# Patient Record
Sex: Male | Born: 1940 | Race: White | Hispanic: No | Marital: Married
Health system: Southern US, Community
[De-identification: ages and names within clinical notes are randomized; demographics above are authoritative.]

---

## 2015-06-18 DIAGNOSIS — Z5181 Encounter for therapeutic drug level monitoring: Secondary | ICD-10-CM | POA: Diagnosis not present

## 2015-06-18 DIAGNOSIS — Z7901 Long term (current) use of anticoagulants: Secondary | ICD-10-CM | POA: Diagnosis not present

## 2015-06-18 DIAGNOSIS — I480 Paroxysmal atrial fibrillation: Secondary | ICD-10-CM | POA: Diagnosis not present

## 2015-06-18 DIAGNOSIS — N189 Chronic kidney disease, unspecified: Secondary | ICD-10-CM | POA: Diagnosis not present

## 2015-08-20 DIAGNOSIS — Z7901 Long term (current) use of anticoagulants: Secondary | ICD-10-CM | POA: Diagnosis not present

## 2015-09-24 DIAGNOSIS — N189 Chronic kidney disease, unspecified: Secondary | ICD-10-CM | POA: Diagnosis not present

## 2015-09-24 DIAGNOSIS — I10 Essential (primary) hypertension: Secondary | ICD-10-CM | POA: Diagnosis not present

## 2015-09-24 DIAGNOSIS — E559 Vitamin D deficiency, unspecified: Secondary | ICD-10-CM | POA: Diagnosis not present

## 2015-09-24 DIAGNOSIS — Z8349 Family history of other endocrine, nutritional and metabolic diseases: Secondary | ICD-10-CM | POA: Diagnosis not present

## 2015-09-24 DIAGNOSIS — Z125 Encounter for screening for malignant neoplasm of prostate: Secondary | ICD-10-CM | POA: Diagnosis not present

## 2015-09-24 DIAGNOSIS — I129 Hypertensive chronic kidney disease with stage 1 through stage 4 chronic kidney disease, or unspecified chronic kidney disease: Secondary | ICD-10-CM | POA: Diagnosis not present

## 2015-09-24 DIAGNOSIS — Z8639 Personal history of other endocrine, nutritional and metabolic disease: Secondary | ICD-10-CM | POA: Diagnosis not present

## 2015-09-24 DIAGNOSIS — Z0000 Encounter for general adult medical examination without abnormal findings: Secondary | ICD-10-CM | POA: Diagnosis not present

## 2015-09-24 DIAGNOSIS — E785 Hyperlipidemia, unspecified: Secondary | ICD-10-CM | POA: Diagnosis not present

## 2015-09-24 DIAGNOSIS — Z7901 Long term (current) use of anticoagulants: Secondary | ICD-10-CM | POA: Diagnosis not present

## 2015-09-24 DIAGNOSIS — I480 Paroxysmal atrial fibrillation: Secondary | ICD-10-CM | POA: Diagnosis not present

## 2015-09-24 DIAGNOSIS — Z79899 Other long term (current) drug therapy: Secondary | ICD-10-CM | POA: Diagnosis not present

## 2015-09-24 DIAGNOSIS — I4891 Unspecified atrial fibrillation: Secondary | ICD-10-CM | POA: Diagnosis not present

## 2015-10-03 DIAGNOSIS — N179 Acute kidney failure, unspecified: Secondary | ICD-10-CM | POA: Diagnosis not present

## 2015-10-15 DIAGNOSIS — Z7901 Long term (current) use of anticoagulants: Secondary | ICD-10-CM | POA: Diagnosis not present

## 2015-10-15 DIAGNOSIS — I480 Paroxysmal atrial fibrillation: Secondary | ICD-10-CM | POA: Diagnosis not present

## 2015-10-15 DIAGNOSIS — Z5181 Encounter for therapeutic drug level monitoring: Secondary | ICD-10-CM | POA: Diagnosis not present

## 2015-11-12 DIAGNOSIS — Z7901 Long term (current) use of anticoagulants: Secondary | ICD-10-CM | POA: Diagnosis not present

## 2015-11-12 DIAGNOSIS — I480 Paroxysmal atrial fibrillation: Secondary | ICD-10-CM | POA: Diagnosis not present

## 2015-12-24 DIAGNOSIS — I480 Paroxysmal atrial fibrillation: Secondary | ICD-10-CM | POA: Diagnosis not present

## 2015-12-24 DIAGNOSIS — I499 Cardiac arrhythmia, unspecified: Secondary | ICD-10-CM | POA: Diagnosis not present

## 2016-01-07 DIAGNOSIS — I480 Paroxysmal atrial fibrillation: Secondary | ICD-10-CM | POA: Diagnosis not present

## 2016-01-21 DIAGNOSIS — I480 Paroxysmal atrial fibrillation: Secondary | ICD-10-CM | POA: Diagnosis not present

## 2016-01-21 DIAGNOSIS — Z7901 Long term (current) use of anticoagulants: Secondary | ICD-10-CM | POA: Diagnosis not present

## 2016-01-30 DIAGNOSIS — Z7901 Long term (current) use of anticoagulants: Secondary | ICD-10-CM | POA: Diagnosis not present

## 2016-01-30 DIAGNOSIS — I480 Paroxysmal atrial fibrillation: Secondary | ICD-10-CM | POA: Diagnosis not present

## 2016-03-02 DIAGNOSIS — I480 Paroxysmal atrial fibrillation: Secondary | ICD-10-CM | POA: Diagnosis not present

## 2016-03-02 DIAGNOSIS — Z7901 Long term (current) use of anticoagulants: Secondary | ICD-10-CM | POA: Diagnosis not present

## 2016-03-02 DIAGNOSIS — E7800 Pure hypercholesterolemia, unspecified: Secondary | ICD-10-CM | POA: Diagnosis not present

## 2016-03-02 DIAGNOSIS — I10 Essential (primary) hypertension: Secondary | ICD-10-CM | POA: Diagnosis not present

## 2016-03-04 DIAGNOSIS — I480 Paroxysmal atrial fibrillation: Secondary | ICD-10-CM | POA: Diagnosis not present

## 2016-03-10 DIAGNOSIS — Z7901 Long term (current) use of anticoagulants: Secondary | ICD-10-CM | POA: Diagnosis not present

## 2016-03-10 DIAGNOSIS — I480 Paroxysmal atrial fibrillation: Secondary | ICD-10-CM | POA: Diagnosis not present

## 2016-03-17 DIAGNOSIS — Z7901 Long term (current) use of anticoagulants: Secondary | ICD-10-CM | POA: Diagnosis not present

## 2016-03-17 DIAGNOSIS — I480 Paroxysmal atrial fibrillation: Secondary | ICD-10-CM | POA: Diagnosis not present

## 2016-03-24 DIAGNOSIS — I480 Paroxysmal atrial fibrillation: Secondary | ICD-10-CM | POA: Diagnosis not present

## 2016-03-24 DIAGNOSIS — Z7901 Long term (current) use of anticoagulants: Secondary | ICD-10-CM | POA: Diagnosis not present

## 2016-03-29 DIAGNOSIS — I480 Paroxysmal atrial fibrillation: Secondary | ICD-10-CM | POA: Diagnosis not present

## 2016-03-30 DIAGNOSIS — E785 Hyperlipidemia, unspecified: Secondary | ICD-10-CM | POA: Diagnosis not present

## 2016-03-30 DIAGNOSIS — I480 Paroxysmal atrial fibrillation: Secondary | ICD-10-CM | POA: Diagnosis not present

## 2016-03-30 DIAGNOSIS — Z79899 Other long term (current) drug therapy: Secondary | ICD-10-CM | POA: Diagnosis not present

## 2016-03-30 DIAGNOSIS — I10 Essential (primary) hypertension: Secondary | ICD-10-CM | POA: Diagnosis not present

## 2016-03-30 DIAGNOSIS — I129 Hypertensive chronic kidney disease with stage 1 through stage 4 chronic kidney disease, or unspecified chronic kidney disease: Secondary | ICD-10-CM | POA: Diagnosis not present

## 2016-03-30 DIAGNOSIS — Z7901 Long term (current) use of anticoagulants: Secondary | ICD-10-CM | POA: Diagnosis not present

## 2016-03-30 DIAGNOSIS — N189 Chronic kidney disease, unspecified: Secondary | ICD-10-CM | POA: Diagnosis not present

## 2016-03-31 DIAGNOSIS — I480 Paroxysmal atrial fibrillation: Secondary | ICD-10-CM | POA: Diagnosis not present

## 2016-04-06 DIAGNOSIS — Z7901 Long term (current) use of anticoagulants: Secondary | ICD-10-CM | POA: Diagnosis not present

## 2016-04-06 DIAGNOSIS — I480 Paroxysmal atrial fibrillation: Secondary | ICD-10-CM | POA: Diagnosis not present

## 2016-04-06 DIAGNOSIS — Z5181 Encounter for therapeutic drug level monitoring: Secondary | ICD-10-CM | POA: Diagnosis not present

## 2016-04-13 DIAGNOSIS — I480 Paroxysmal atrial fibrillation: Secondary | ICD-10-CM | POA: Diagnosis not present

## 2016-04-13 DIAGNOSIS — Z7901 Long term (current) use of anticoagulants: Secondary | ICD-10-CM | POA: Diagnosis not present

## 2016-04-27 DIAGNOSIS — Z7901 Long term (current) use of anticoagulants: Secondary | ICD-10-CM | POA: Diagnosis not present

## 2016-05-24 DIAGNOSIS — I480 Paroxysmal atrial fibrillation: Secondary | ICD-10-CM | POA: Diagnosis not present

## 2016-06-08 DIAGNOSIS — I480 Paroxysmal atrial fibrillation: Secondary | ICD-10-CM | POA: Diagnosis not present

## 2016-06-08 DIAGNOSIS — Z7901 Long term (current) use of anticoagulants: Secondary | ICD-10-CM | POA: Diagnosis not present

## 2016-07-08 DIAGNOSIS — Z7901 Long term (current) use of anticoagulants: Secondary | ICD-10-CM | POA: Diagnosis not present

## 2016-07-08 DIAGNOSIS — I480 Paroxysmal atrial fibrillation: Secondary | ICD-10-CM | POA: Diagnosis not present

## 2016-08-20 DIAGNOSIS — H00014 Hordeolum externum left upper eyelid: Secondary | ICD-10-CM | POA: Diagnosis not present

## 2016-08-20 DIAGNOSIS — H01022 Squamous blepharitis right lower eyelid: Secondary | ICD-10-CM | POA: Diagnosis not present

## 2016-08-20 DIAGNOSIS — H01025 Squamous blepharitis left lower eyelid: Secondary | ICD-10-CM | POA: Diagnosis not present

## 2016-08-24 DIAGNOSIS — I480 Paroxysmal atrial fibrillation: Secondary | ICD-10-CM | POA: Diagnosis not present

## 2016-08-24 DIAGNOSIS — Z7901 Long term (current) use of anticoagulants: Secondary | ICD-10-CM | POA: Diagnosis not present

## 2016-09-28 DIAGNOSIS — E782 Mixed hyperlipidemia: Secondary | ICD-10-CM | POA: Diagnosis not present

## 2016-09-28 DIAGNOSIS — I480 Paroxysmal atrial fibrillation: Secondary | ICD-10-CM | POA: Diagnosis not present

## 2016-09-28 DIAGNOSIS — I4891 Unspecified atrial fibrillation: Secondary | ICD-10-CM | POA: Diagnosis not present

## 2016-09-28 DIAGNOSIS — N189 Chronic kidney disease, unspecified: Secondary | ICD-10-CM | POA: Diagnosis not present

## 2016-09-28 DIAGNOSIS — Z0000 Encounter for general adult medical examination without abnormal findings: Secondary | ICD-10-CM | POA: Diagnosis not present

## 2016-09-28 DIAGNOSIS — I10 Essential (primary) hypertension: Secondary | ICD-10-CM | POA: Diagnosis not present

## 2016-09-28 DIAGNOSIS — Z7901 Long term (current) use of anticoagulants: Secondary | ICD-10-CM | POA: Diagnosis not present

## 2016-10-18 DIAGNOSIS — M25531 Pain in right wrist: Secondary | ICD-10-CM | POA: Diagnosis not present

## 2016-10-18 DIAGNOSIS — M25431 Effusion, right wrist: Secondary | ICD-10-CM | POA: Diagnosis not present

## 2016-10-18 DIAGNOSIS — I480 Paroxysmal atrial fibrillation: Secondary | ICD-10-CM | POA: Diagnosis not present

## 2016-10-19 DIAGNOSIS — I480 Paroxysmal atrial fibrillation: Secondary | ICD-10-CM | POA: Diagnosis not present

## 2016-10-19 DIAGNOSIS — E782 Mixed hyperlipidemia: Secondary | ICD-10-CM | POA: Diagnosis not present

## 2016-10-19 DIAGNOSIS — I10 Essential (primary) hypertension: Secondary | ICD-10-CM | POA: Diagnosis not present

## 2016-11-05 DIAGNOSIS — I480 Paroxysmal atrial fibrillation: Secondary | ICD-10-CM | POA: Diagnosis not present

## 2016-11-05 DIAGNOSIS — Z7901 Long term (current) use of anticoagulants: Secondary | ICD-10-CM | POA: Diagnosis not present

## 2016-11-09 DIAGNOSIS — Z125 Encounter for screening for malignant neoplasm of prostate: Secondary | ICD-10-CM | POA: Diagnosis not present

## 2016-11-09 DIAGNOSIS — M65841 Other synovitis and tenosynovitis, right hand: Secondary | ICD-10-CM | POA: Diagnosis not present

## 2016-11-09 DIAGNOSIS — Z79899 Other long term (current) drug therapy: Secondary | ICD-10-CM | POA: Diagnosis not present

## 2016-11-09 DIAGNOSIS — Z0000 Encounter for general adult medical examination without abnormal findings: Secondary | ICD-10-CM | POA: Diagnosis not present

## 2016-11-30 DIAGNOSIS — I480 Paroxysmal atrial fibrillation: Secondary | ICD-10-CM | POA: Diagnosis not present

## 2016-11-30 DIAGNOSIS — Z7901 Long term (current) use of anticoagulants: Secondary | ICD-10-CM | POA: Diagnosis not present

## 2017-01-03 DIAGNOSIS — I480 Paroxysmal atrial fibrillation: Secondary | ICD-10-CM | POA: Diagnosis not present

## 2017-01-03 DIAGNOSIS — Z7901 Long term (current) use of anticoagulants: Secondary | ICD-10-CM | POA: Diagnosis not present

## 2017-01-20 DIAGNOSIS — L720 Epidermal cyst: Secondary | ICD-10-CM | POA: Diagnosis not present

## 2017-01-20 DIAGNOSIS — D1801 Hemangioma of skin and subcutaneous tissue: Secondary | ICD-10-CM | POA: Diagnosis not present

## 2017-01-20 DIAGNOSIS — L738 Other specified follicular disorders: Secondary | ICD-10-CM | POA: Diagnosis not present

## 2017-01-20 DIAGNOSIS — L817 Pigmented purpuric dermatosis: Secondary | ICD-10-CM | POA: Diagnosis not present

## 2017-01-20 DIAGNOSIS — D229 Melanocytic nevi, unspecified: Secondary | ICD-10-CM | POA: Diagnosis not present

## 2017-01-20 DIAGNOSIS — L821 Other seborrheic keratosis: Secondary | ICD-10-CM | POA: Diagnosis not present

## 2017-01-28 DIAGNOSIS — I480 Paroxysmal atrial fibrillation: Secondary | ICD-10-CM | POA: Diagnosis not present

## 2017-01-28 DIAGNOSIS — Z7901 Long term (current) use of anticoagulants: Secondary | ICD-10-CM | POA: Diagnosis not present

## 2017-02-18 DIAGNOSIS — I480 Paroxysmal atrial fibrillation: Secondary | ICD-10-CM | POA: Diagnosis not present

## 2017-02-18 DIAGNOSIS — Z7901 Long term (current) use of anticoagulants: Secondary | ICD-10-CM | POA: Diagnosis not present

## 2017-03-08 DIAGNOSIS — I10 Essential (primary) hypertension: Secondary | ICD-10-CM | POA: Diagnosis not present

## 2017-03-08 DIAGNOSIS — E782 Mixed hyperlipidemia: Secondary | ICD-10-CM | POA: Diagnosis not present

## 2017-03-08 DIAGNOSIS — I480 Paroxysmal atrial fibrillation: Secondary | ICD-10-CM | POA: Diagnosis not present

## 2017-03-30 DIAGNOSIS — Z7901 Long term (current) use of anticoagulants: Secondary | ICD-10-CM | POA: Diagnosis not present

## 2017-03-30 DIAGNOSIS — I480 Paroxysmal atrial fibrillation: Secondary | ICD-10-CM | POA: Diagnosis not present

## 2017-05-09 DIAGNOSIS — I480 Paroxysmal atrial fibrillation: Secondary | ICD-10-CM | POA: Diagnosis not present

## 2017-05-09 DIAGNOSIS — Z7901 Long term (current) use of anticoagulants: Secondary | ICD-10-CM | POA: Diagnosis not present

## 2017-05-12 DIAGNOSIS — N183 Chronic kidney disease, stage 3 (moderate): Secondary | ICD-10-CM | POA: Diagnosis not present

## 2017-05-12 DIAGNOSIS — E782 Mixed hyperlipidemia: Secondary | ICD-10-CM | POA: Diagnosis not present

## 2017-05-12 DIAGNOSIS — E538 Deficiency of other specified B group vitamins: Secondary | ICD-10-CM | POA: Diagnosis not present

## 2017-05-12 DIAGNOSIS — I480 Paroxysmal atrial fibrillation: Secondary | ICD-10-CM | POA: Diagnosis not present

## 2017-05-12 DIAGNOSIS — Z79899 Other long term (current) drug therapy: Secondary | ICD-10-CM | POA: Diagnosis not present

## 2017-05-12 DIAGNOSIS — Z0000 Encounter for general adult medical examination without abnormal findings: Secondary | ICD-10-CM | POA: Diagnosis not present

## 2017-06-13 DIAGNOSIS — H5203 Hypermetropia, bilateral: Secondary | ICD-10-CM | POA: Diagnosis not present

## 2017-06-13 DIAGNOSIS — H35372 Puckering of macula, left eye: Secondary | ICD-10-CM | POA: Diagnosis not present

## 2017-06-13 DIAGNOSIS — H524 Presbyopia: Secondary | ICD-10-CM | POA: Diagnosis not present

## 2017-06-13 DIAGNOSIS — H2513 Age-related nuclear cataract, bilateral: Secondary | ICD-10-CM | POA: Diagnosis not present

## 2017-06-17 DIAGNOSIS — I480 Paroxysmal atrial fibrillation: Secondary | ICD-10-CM | POA: Diagnosis not present

## 2017-06-17 DIAGNOSIS — Z7901 Long term (current) use of anticoagulants: Secondary | ICD-10-CM | POA: Diagnosis not present

## 2017-07-04 DIAGNOSIS — Z5181 Encounter for therapeutic drug level monitoring: Secondary | ICD-10-CM | POA: Diagnosis not present

## 2017-07-04 DIAGNOSIS — I480 Paroxysmal atrial fibrillation: Secondary | ICD-10-CM | POA: Diagnosis not present

## 2017-07-04 DIAGNOSIS — Z7901 Long term (current) use of anticoagulants: Secondary | ICD-10-CM | POA: Diagnosis not present

## 2017-08-24 DIAGNOSIS — Z7901 Long term (current) use of anticoagulants: Secondary | ICD-10-CM | POA: Diagnosis not present

## 2017-08-24 DIAGNOSIS — I480 Paroxysmal atrial fibrillation: Secondary | ICD-10-CM | POA: Diagnosis not present

## 2017-09-21 DIAGNOSIS — Z7901 Long term (current) use of anticoagulants: Secondary | ICD-10-CM | POA: Diagnosis not present

## 2017-09-21 DIAGNOSIS — I480 Paroxysmal atrial fibrillation: Secondary | ICD-10-CM | POA: Diagnosis not present

## 2017-10-18 DIAGNOSIS — I480 Paroxysmal atrial fibrillation: Secondary | ICD-10-CM | POA: Diagnosis not present

## 2017-10-18 DIAGNOSIS — Z7901 Long term (current) use of anticoagulants: Secondary | ICD-10-CM | POA: Diagnosis not present

## 2017-11-10 DIAGNOSIS — E782 Mixed hyperlipidemia: Secondary | ICD-10-CM | POA: Diagnosis not present

## 2017-11-10 DIAGNOSIS — Z79899 Other long term (current) drug therapy: Secondary | ICD-10-CM | POA: Diagnosis not present

## 2017-11-10 DIAGNOSIS — E538 Deficiency of other specified B group vitamins: Secondary | ICD-10-CM | POA: Diagnosis not present

## 2017-11-17 DIAGNOSIS — Z0000 Encounter for general adult medical examination without abnormal findings: Secondary | ICD-10-CM | POA: Diagnosis not present

## 2017-11-17 DIAGNOSIS — I480 Paroxysmal atrial fibrillation: Secondary | ICD-10-CM | POA: Diagnosis not present

## 2017-11-17 DIAGNOSIS — E782 Mixed hyperlipidemia: Secondary | ICD-10-CM | POA: Diagnosis not present

## 2017-11-17 DIAGNOSIS — R739 Hyperglycemia, unspecified: Secondary | ICD-10-CM | POA: Diagnosis not present

## 2017-11-17 DIAGNOSIS — N183 Chronic kidney disease, stage 3 (moderate): Secondary | ICD-10-CM | POA: Diagnosis not present

## 2017-11-28 DIAGNOSIS — Z7901 Long term (current) use of anticoagulants: Secondary | ICD-10-CM | POA: Diagnosis not present

## 2017-11-28 DIAGNOSIS — I480 Paroxysmal atrial fibrillation: Secondary | ICD-10-CM | POA: Diagnosis not present

## 2017-12-12 DIAGNOSIS — H35372 Puckering of macula, left eye: Secondary | ICD-10-CM | POA: Diagnosis not present

## 2017-12-26 DIAGNOSIS — I480 Paroxysmal atrial fibrillation: Secondary | ICD-10-CM | POA: Diagnosis not present

## 2017-12-26 DIAGNOSIS — Z5181 Encounter for therapeutic drug level monitoring: Secondary | ICD-10-CM | POA: Diagnosis not present

## 2017-12-26 DIAGNOSIS — Z7901 Long term (current) use of anticoagulants: Secondary | ICD-10-CM | POA: Diagnosis not present

## 2018-01-09 DIAGNOSIS — I480 Paroxysmal atrial fibrillation: Secondary | ICD-10-CM | POA: Diagnosis not present

## 2018-01-23 DIAGNOSIS — I480 Paroxysmal atrial fibrillation: Secondary | ICD-10-CM | POA: Diagnosis not present

## 2018-01-23 DIAGNOSIS — Z5181 Encounter for therapeutic drug level monitoring: Secondary | ICD-10-CM | POA: Diagnosis not present

## 2018-01-23 DIAGNOSIS — Z7901 Long term (current) use of anticoagulants: Secondary | ICD-10-CM | POA: Diagnosis not present

## 2018-03-07 DIAGNOSIS — L570 Actinic keratosis: Secondary | ICD-10-CM | POA: Diagnosis not present

## 2018-03-07 DIAGNOSIS — Z9889 Other specified postprocedural states: Secondary | ICD-10-CM | POA: Diagnosis not present

## 2018-03-07 DIAGNOSIS — D485 Neoplasm of uncertain behavior of skin: Secondary | ICD-10-CM | POA: Diagnosis not present

## 2018-03-07 DIAGNOSIS — L578 Other skin changes due to chronic exposure to nonionizing radiation: Secondary | ICD-10-CM | POA: Diagnosis not present

## 2018-03-07 DIAGNOSIS — L814 Other melanin hyperpigmentation: Secondary | ICD-10-CM | POA: Diagnosis not present

## 2018-03-07 DIAGNOSIS — I10 Essential (primary) hypertension: Secondary | ICD-10-CM | POA: Diagnosis not present

## 2018-03-07 DIAGNOSIS — D2260 Melanocytic nevi of unspecified upper limb, including shoulder: Secondary | ICD-10-CM | POA: Diagnosis not present

## 2018-03-07 DIAGNOSIS — Z79899 Other long term (current) drug therapy: Secondary | ICD-10-CM | POA: Diagnosis not present

## 2018-03-07 DIAGNOSIS — L821 Other seborrheic keratosis: Secondary | ICD-10-CM | POA: Diagnosis not present

## 2018-03-07 DIAGNOSIS — D225 Melanocytic nevi of trunk: Secondary | ICD-10-CM | POA: Diagnosis not present

## 2018-03-07 DIAGNOSIS — D2270 Melanocytic nevi of unspecified lower limb, including hip: Secondary | ICD-10-CM | POA: Diagnosis not present

## 2018-03-07 DIAGNOSIS — Z7901 Long term (current) use of anticoagulants: Secondary | ICD-10-CM | POA: Diagnosis not present

## 2018-03-07 DIAGNOSIS — I872 Venous insufficiency (chronic) (peripheral): Secondary | ICD-10-CM | POA: Diagnosis not present

## 2018-03-07 DIAGNOSIS — I480 Paroxysmal atrial fibrillation: Secondary | ICD-10-CM | POA: Diagnosis not present

## 2018-04-17 DIAGNOSIS — I480 Paroxysmal atrial fibrillation: Secondary | ICD-10-CM | POA: Diagnosis not present

## 2018-05-15 DIAGNOSIS — I480 Paroxysmal atrial fibrillation: Secondary | ICD-10-CM | POA: Diagnosis not present

## 2018-05-15 DIAGNOSIS — Z7901 Long term (current) use of anticoagulants: Secondary | ICD-10-CM | POA: Diagnosis not present

## 2018-07-28 DIAGNOSIS — I480 Paroxysmal atrial fibrillation: Secondary | ICD-10-CM | POA: Diagnosis not present

## 2018-08-21 DIAGNOSIS — I480 Paroxysmal atrial fibrillation: Secondary | ICD-10-CM | POA: Diagnosis not present

## 2018-08-21 DIAGNOSIS — Z7901 Long term (current) use of anticoagulants: Secondary | ICD-10-CM | POA: Diagnosis not present

## 2018-08-25 DIAGNOSIS — H35372 Puckering of macula, left eye: Secondary | ICD-10-CM | POA: Diagnosis not present

## 2018-08-25 DIAGNOSIS — H2513 Age-related nuclear cataract, bilateral: Secondary | ICD-10-CM | POA: Diagnosis not present

## 2018-08-25 DIAGNOSIS — H524 Presbyopia: Secondary | ICD-10-CM | POA: Diagnosis not present

## 2018-08-25 DIAGNOSIS — H5203 Hypermetropia, bilateral: Secondary | ICD-10-CM | POA: Diagnosis not present

## 2018-08-25 DIAGNOSIS — H52223 Regular astigmatism, bilateral: Secondary | ICD-10-CM | POA: Diagnosis not present

## 2018-10-02 DIAGNOSIS — I480 Paroxysmal atrial fibrillation: Secondary | ICD-10-CM | POA: Diagnosis not present

## 2018-10-16 DIAGNOSIS — Z7901 Long term (current) use of anticoagulants: Secondary | ICD-10-CM | POA: Diagnosis not present

## 2018-10-16 DIAGNOSIS — I480 Paroxysmal atrial fibrillation: Secondary | ICD-10-CM | POA: Diagnosis not present

## 2018-10-30 DIAGNOSIS — R739 Hyperglycemia, unspecified: Secondary | ICD-10-CM | POA: Diagnosis not present

## 2018-10-30 DIAGNOSIS — Z7901 Long term (current) use of anticoagulants: Secondary | ICD-10-CM | POA: Diagnosis not present

## 2018-10-30 DIAGNOSIS — E782 Mixed hyperlipidemia: Secondary | ICD-10-CM | POA: Diagnosis not present

## 2018-11-06 DIAGNOSIS — D369 Benign neoplasm, unspecified site: Secondary | ICD-10-CM | POA: Diagnosis not present

## 2018-11-06 DIAGNOSIS — I480 Paroxysmal atrial fibrillation: Secondary | ICD-10-CM | POA: Diagnosis not present

## 2018-11-06 DIAGNOSIS — E538 Deficiency of other specified B group vitamins: Secondary | ICD-10-CM | POA: Diagnosis not present

## 2018-11-06 DIAGNOSIS — Z125 Encounter for screening for malignant neoplasm of prostate: Secondary | ICD-10-CM | POA: Diagnosis not present

## 2018-11-06 DIAGNOSIS — E782 Mixed hyperlipidemia: Secondary | ICD-10-CM | POA: Diagnosis not present

## 2018-11-06 DIAGNOSIS — Z0000 Encounter for general adult medical examination without abnormal findings: Secondary | ICD-10-CM | POA: Diagnosis not present

## 2018-11-06 DIAGNOSIS — N183 Chronic kidney disease, stage 3 (moderate): Secondary | ICD-10-CM | POA: Diagnosis not present

## 2018-11-28 DIAGNOSIS — Z7901 Long term (current) use of anticoagulants: Secondary | ICD-10-CM | POA: Diagnosis not present

## 2018-11-28 DIAGNOSIS — I480 Paroxysmal atrial fibrillation: Secondary | ICD-10-CM | POA: Diagnosis not present

## 2019-01-09 DIAGNOSIS — I480 Paroxysmal atrial fibrillation: Secondary | ICD-10-CM | POA: Diagnosis not present

## 2019-01-09 DIAGNOSIS — Z7901 Long term (current) use of anticoagulants: Secondary | ICD-10-CM | POA: Diagnosis not present

## 2019-01-25 DIAGNOSIS — I480 Paroxysmal atrial fibrillation: Secondary | ICD-10-CM | POA: Diagnosis not present

## 2019-03-08 DIAGNOSIS — D225 Melanocytic nevi of trunk: Secondary | ICD-10-CM | POA: Diagnosis not present

## 2019-03-08 DIAGNOSIS — L821 Other seborrheic keratosis: Secondary | ICD-10-CM | POA: Diagnosis not present

## 2019-03-08 DIAGNOSIS — L570 Actinic keratosis: Secondary | ICD-10-CM | POA: Diagnosis not present

## 2019-03-08 DIAGNOSIS — L814 Other melanin hyperpigmentation: Secondary | ICD-10-CM | POA: Diagnosis not present

## 2019-03-08 DIAGNOSIS — Z87898 Personal history of other specified conditions: Secondary | ICD-10-CM | POA: Diagnosis not present

## 2019-03-08 DIAGNOSIS — L723 Sebaceous cyst: Secondary | ICD-10-CM | POA: Diagnosis not present

## 2019-03-08 DIAGNOSIS — I480 Paroxysmal atrial fibrillation: Secondary | ICD-10-CM | POA: Diagnosis not present

## 2019-03-08 DIAGNOSIS — D2260 Melanocytic nevi of unspecified upper limb, including shoulder: Secondary | ICD-10-CM | POA: Diagnosis not present

## 2019-03-08 DIAGNOSIS — D2270 Melanocytic nevi of unspecified lower limb, including hip: Secondary | ICD-10-CM | POA: Diagnosis not present

## 2019-03-08 DIAGNOSIS — D1801 Hemangioma of skin and subcutaneous tissue: Secondary | ICD-10-CM | POA: Diagnosis not present

## 2019-04-12 DIAGNOSIS — Z7901 Long term (current) use of anticoagulants: Secondary | ICD-10-CM | POA: Diagnosis not present

## 2019-04-12 DIAGNOSIS — I480 Paroxysmal atrial fibrillation: Secondary | ICD-10-CM | POA: Diagnosis not present

## 2019-04-26 DIAGNOSIS — Z7901 Long term (current) use of anticoagulants: Secondary | ICD-10-CM | POA: Diagnosis not present

## 2019-04-26 DIAGNOSIS — I480 Paroxysmal atrial fibrillation: Secondary | ICD-10-CM | POA: Diagnosis not present

## 2019-05-17 DIAGNOSIS — Z7901 Long term (current) use of anticoagulants: Secondary | ICD-10-CM | POA: Diagnosis not present

## 2019-05-17 DIAGNOSIS — I480 Paroxysmal atrial fibrillation: Secondary | ICD-10-CM | POA: Diagnosis not present

## 2019-06-14 DIAGNOSIS — Z7901 Long term (current) use of anticoagulants: Secondary | ICD-10-CM | POA: Diagnosis not present

## 2019-06-14 DIAGNOSIS — I480 Paroxysmal atrial fibrillation: Secondary | ICD-10-CM | POA: Diagnosis not present

## 2019-07-10 DIAGNOSIS — I480 Paroxysmal atrial fibrillation: Secondary | ICD-10-CM | POA: Diagnosis not present

## 2019-07-10 DIAGNOSIS — I10 Essential (primary) hypertension: Secondary | ICD-10-CM | POA: Diagnosis not present

## 2019-07-10 DIAGNOSIS — E782 Mixed hyperlipidemia: Secondary | ICD-10-CM | POA: Diagnosis not present

## 2019-07-10 DIAGNOSIS — Z7901 Long term (current) use of anticoagulants: Secondary | ICD-10-CM | POA: Diagnosis not present

## 2019-07-26 DIAGNOSIS — I480 Paroxysmal atrial fibrillation: Secondary | ICD-10-CM | POA: Diagnosis not present

## 2019-07-26 DIAGNOSIS — I10 Essential (primary) hypertension: Secondary | ICD-10-CM | POA: Diagnosis not present

## 2019-07-26 DIAGNOSIS — Z7901 Long term (current) use of anticoagulants: Secondary | ICD-10-CM | POA: Diagnosis not present

## 2019-10-31 DIAGNOSIS — E782 Mixed hyperlipidemia: Secondary | ICD-10-CM | POA: Diagnosis not present

## 2019-10-31 DIAGNOSIS — Z125 Encounter for screening for malignant neoplasm of prostate: Secondary | ICD-10-CM | POA: Diagnosis not present

## 2019-10-31 DIAGNOSIS — E538 Deficiency of other specified B group vitamins: Secondary | ICD-10-CM | POA: Diagnosis not present

## 2019-11-01 DIAGNOSIS — I480 Paroxysmal atrial fibrillation: Secondary | ICD-10-CM | POA: Diagnosis not present

## 2019-11-01 DIAGNOSIS — Z7901 Long term (current) use of anticoagulants: Secondary | ICD-10-CM | POA: Diagnosis not present

## 2019-11-07 DIAGNOSIS — I480 Paroxysmal atrial fibrillation: Secondary | ICD-10-CM | POA: Diagnosis not present

## 2019-11-07 DIAGNOSIS — E538 Deficiency of other specified B group vitamins: Secondary | ICD-10-CM | POA: Diagnosis not present

## 2019-11-07 DIAGNOSIS — Z1212 Encounter for screening for malignant neoplasm of rectum: Secondary | ICD-10-CM | POA: Diagnosis not present

## 2019-11-07 DIAGNOSIS — Z0000 Encounter for general adult medical examination without abnormal findings: Secondary | ICD-10-CM | POA: Diagnosis not present

## 2019-11-07 DIAGNOSIS — D369 Benign neoplasm, unspecified site: Secondary | ICD-10-CM | POA: Diagnosis not present

## 2019-11-07 DIAGNOSIS — E782 Mixed hyperlipidemia: Secondary | ICD-10-CM | POA: Diagnosis not present

## 2019-11-07 DIAGNOSIS — Z125 Encounter for screening for malignant neoplasm of prostate: Secondary | ICD-10-CM | POA: Diagnosis not present

## 2019-11-07 DIAGNOSIS — R739 Hyperglycemia, unspecified: Secondary | ICD-10-CM | POA: Diagnosis not present

## 2019-11-20 DIAGNOSIS — H524 Presbyopia: Secondary | ICD-10-CM | POA: Diagnosis not present

## 2019-11-20 DIAGNOSIS — H52223 Regular astigmatism, bilateral: Secondary | ICD-10-CM | POA: Diagnosis not present

## 2019-11-20 DIAGNOSIS — H2513 Age-related nuclear cataract, bilateral: Secondary | ICD-10-CM | POA: Diagnosis not present

## 2019-11-20 DIAGNOSIS — H5203 Hypermetropia, bilateral: Secondary | ICD-10-CM | POA: Diagnosis not present

## 2019-11-20 DIAGNOSIS — H35372 Puckering of macula, left eye: Secondary | ICD-10-CM | POA: Diagnosis not present

## 2019-12-18 DIAGNOSIS — Z1212 Encounter for screening for malignant neoplasm of rectum: Secondary | ICD-10-CM | POA: Diagnosis not present

## 2020-01-02 DIAGNOSIS — I480 Paroxysmal atrial fibrillation: Secondary | ICD-10-CM | POA: Diagnosis not present

## 2020-01-02 DIAGNOSIS — Z7901 Long term (current) use of anticoagulants: Secondary | ICD-10-CM | POA: Diagnosis not present

## 2020-02-01 DIAGNOSIS — E782 Mixed hyperlipidemia: Secondary | ICD-10-CM | POA: Diagnosis not present

## 2020-02-01 DIAGNOSIS — I10 Essential (primary) hypertension: Secondary | ICD-10-CM | POA: Diagnosis not present

## 2020-02-01 DIAGNOSIS — Z7901 Long term (current) use of anticoagulants: Secondary | ICD-10-CM | POA: Diagnosis not present

## 2020-02-01 DIAGNOSIS — I480 Paroxysmal atrial fibrillation: Secondary | ICD-10-CM | POA: Diagnosis not present

## 2020-02-28 DIAGNOSIS — Z7901 Long term (current) use of anticoagulants: Secondary | ICD-10-CM | POA: Diagnosis not present

## 2020-02-28 DIAGNOSIS — I480 Paroxysmal atrial fibrillation: Secondary | ICD-10-CM | POA: Diagnosis not present

## 2020-03-10 DIAGNOSIS — D2260 Melanocytic nevi of unspecified upper limb, including shoulder: Secondary | ICD-10-CM | POA: Diagnosis not present

## 2020-03-10 DIAGNOSIS — L821 Other seborrheic keratosis: Secondary | ICD-10-CM | POA: Diagnosis not present

## 2020-03-10 DIAGNOSIS — Z872 Personal history of diseases of the skin and subcutaneous tissue: Secondary | ICD-10-CM | POA: Diagnosis not present

## 2020-03-10 DIAGNOSIS — L814 Other melanin hyperpigmentation: Secondary | ICD-10-CM | POA: Diagnosis not present

## 2020-03-10 DIAGNOSIS — Z86018 Personal history of other benign neoplasm: Secondary | ICD-10-CM | POA: Diagnosis not present

## 2020-03-10 DIAGNOSIS — L738 Other specified follicular disorders: Secondary | ICD-10-CM | POA: Diagnosis not present

## 2020-03-10 DIAGNOSIS — L578 Other skin changes due to chronic exposure to nonionizing radiation: Secondary | ICD-10-CM | POA: Diagnosis not present

## 2020-03-10 DIAGNOSIS — L219 Seborrheic dermatitis, unspecified: Secondary | ICD-10-CM | POA: Diagnosis not present

## 2020-03-10 DIAGNOSIS — Z1283 Encounter for screening for malignant neoplasm of skin: Secondary | ICD-10-CM | POA: Diagnosis not present

## 2020-03-10 DIAGNOSIS — L720 Epidermal cyst: Secondary | ICD-10-CM | POA: Diagnosis not present

## 2020-03-10 DIAGNOSIS — D225 Melanocytic nevi of trunk: Secondary | ICD-10-CM | POA: Diagnosis not present

## 2020-03-10 DIAGNOSIS — D1801 Hemangioma of skin and subcutaneous tissue: Secondary | ICD-10-CM | POA: Diagnosis not present

## 2020-05-22 DIAGNOSIS — I480 Paroxysmal atrial fibrillation: Secondary | ICD-10-CM | POA: Diagnosis not present

## 2020-05-22 DIAGNOSIS — I10 Essential (primary) hypertension: Secondary | ICD-10-CM | POA: Diagnosis not present

## 2020-06-10 DIAGNOSIS — Z7901 Long term (current) use of anticoagulants: Secondary | ICD-10-CM | POA: Diagnosis not present

## 2020-06-10 DIAGNOSIS — I480 Paroxysmal atrial fibrillation: Secondary | ICD-10-CM | POA: Diagnosis not present

## 2020-07-24 DIAGNOSIS — I480 Paroxysmal atrial fibrillation: Secondary | ICD-10-CM | POA: Diagnosis not present

## 2020-07-24 DIAGNOSIS — Z7901 Long term (current) use of anticoagulants: Secondary | ICD-10-CM | POA: Diagnosis not present

## 2020-08-01 DIAGNOSIS — I10 Essential (primary) hypertension: Secondary | ICD-10-CM | POA: Diagnosis not present

## 2020-08-01 DIAGNOSIS — I480 Paroxysmal atrial fibrillation: Secondary | ICD-10-CM | POA: Diagnosis not present

## 2020-08-01 DIAGNOSIS — E782 Mixed hyperlipidemia: Secondary | ICD-10-CM | POA: Diagnosis not present

## 2020-09-04 DIAGNOSIS — Z7901 Long term (current) use of anticoagulants: Secondary | ICD-10-CM | POA: Diagnosis not present

## 2020-09-04 DIAGNOSIS — I480 Paroxysmal atrial fibrillation: Secondary | ICD-10-CM | POA: Diagnosis not present

## 2020-10-09 DIAGNOSIS — Z7901 Long term (current) use of anticoagulants: Secondary | ICD-10-CM | POA: Diagnosis not present

## 2020-10-09 DIAGNOSIS — I480 Paroxysmal atrial fibrillation: Secondary | ICD-10-CM | POA: Diagnosis not present

## 2020-10-31 DIAGNOSIS — R739 Hyperglycemia, unspecified: Secondary | ICD-10-CM | POA: Diagnosis not present

## 2020-10-31 DIAGNOSIS — Z125 Encounter for screening for malignant neoplasm of prostate: Secondary | ICD-10-CM | POA: Diagnosis not present

## 2020-10-31 DIAGNOSIS — E538 Deficiency of other specified B group vitamins: Secondary | ICD-10-CM | POA: Diagnosis not present

## 2020-10-31 DIAGNOSIS — I480 Paroxysmal atrial fibrillation: Secondary | ICD-10-CM | POA: Diagnosis not present

## 2020-10-31 DIAGNOSIS — Z7901 Long term (current) use of anticoagulants: Secondary | ICD-10-CM | POA: Diagnosis not present

## 2020-10-31 DIAGNOSIS — E782 Mixed hyperlipidemia: Secondary | ICD-10-CM | POA: Diagnosis not present

## 2020-11-07 DIAGNOSIS — Z0000 Encounter for general adult medical examination without abnormal findings: Secondary | ICD-10-CM | POA: Diagnosis not present

## 2020-11-07 DIAGNOSIS — Z125 Encounter for screening for malignant neoplasm of prostate: Secondary | ICD-10-CM | POA: Diagnosis not present

## 2020-11-07 DIAGNOSIS — I480 Paroxysmal atrial fibrillation: Secondary | ICD-10-CM | POA: Diagnosis not present

## 2020-11-07 DIAGNOSIS — N1832 Chronic kidney disease, stage 3b: Secondary | ICD-10-CM | POA: Diagnosis not present

## 2020-11-07 DIAGNOSIS — E782 Mixed hyperlipidemia: Secondary | ICD-10-CM | POA: Diagnosis not present

## 2020-12-11 DIAGNOSIS — Z7901 Long term (current) use of anticoagulants: Secondary | ICD-10-CM | POA: Diagnosis not present

## 2021-01-08 DIAGNOSIS — I480 Paroxysmal atrial fibrillation: Secondary | ICD-10-CM | POA: Diagnosis not present

## 2021-01-08 DIAGNOSIS — Z7901 Long term (current) use of anticoagulants: Secondary | ICD-10-CM | POA: Diagnosis not present

## 2021-01-22 DIAGNOSIS — Z7901 Long term (current) use of anticoagulants: Secondary | ICD-10-CM | POA: Diagnosis not present

## 2021-02-05 DIAGNOSIS — Z7901 Long term (current) use of anticoagulants: Secondary | ICD-10-CM | POA: Diagnosis not present

## 2021-02-05 DIAGNOSIS — I10 Essential (primary) hypertension: Secondary | ICD-10-CM | POA: Diagnosis not present

## 2021-02-05 DIAGNOSIS — I480 Paroxysmal atrial fibrillation: Secondary | ICD-10-CM | POA: Diagnosis not present

## 2021-02-05 DIAGNOSIS — E782 Mixed hyperlipidemia: Secondary | ICD-10-CM | POA: Diagnosis not present

## 2021-02-06 DIAGNOSIS — Z7901 Long term (current) use of anticoagulants: Secondary | ICD-10-CM | POA: Diagnosis not present

## 2021-02-16 DIAGNOSIS — Z7901 Long term (current) use of anticoagulants: Secondary | ICD-10-CM | POA: Diagnosis not present

## 2021-02-23 DIAGNOSIS — H524 Presbyopia: Secondary | ICD-10-CM | POA: Diagnosis not present

## 2021-02-23 DIAGNOSIS — H5203 Hypermetropia, bilateral: Secondary | ICD-10-CM | POA: Diagnosis not present

## 2021-02-23 DIAGNOSIS — H52223 Regular astigmatism, bilateral: Secondary | ICD-10-CM | POA: Diagnosis not present

## 2021-02-23 DIAGNOSIS — N1832 Chronic kidney disease, stage 3b: Secondary | ICD-10-CM | POA: Diagnosis not present

## 2021-02-23 DIAGNOSIS — H2513 Age-related nuclear cataract, bilateral: Secondary | ICD-10-CM | POA: Diagnosis not present

## 2021-02-23 DIAGNOSIS — I480 Paroxysmal atrial fibrillation: Secondary | ICD-10-CM | POA: Diagnosis not present

## 2021-02-23 DIAGNOSIS — F330 Major depressive disorder, recurrent, mild: Secondary | ICD-10-CM | POA: Diagnosis not present

## 2021-02-23 DIAGNOSIS — H35372 Puckering of macula, left eye: Secondary | ICD-10-CM | POA: Diagnosis not present

## 2021-02-23 DIAGNOSIS — I639 Cerebral infarction, unspecified: Secondary | ICD-10-CM | POA: Diagnosis not present

## 2021-02-24 ENCOUNTER — Other Ambulatory Visit: Payer: Self-pay | Admitting: Internal Medicine

## 2021-02-24 DIAGNOSIS — I639 Cerebral infarction, unspecified: Secondary | ICD-10-CM

## 2021-02-26 ENCOUNTER — Ambulatory Visit
Admission: RE | Admit: 2021-02-26 | Discharge: 2021-02-26 | Disposition: A | Discharge status: Home / Self Care | Payer: PPO | Source: Ambulatory Visit | Attending: Internal Medicine | Admitting: Internal Medicine

## 2021-02-26 ENCOUNTER — Other Ambulatory Visit: Payer: Self-pay

## 2021-02-26 DIAGNOSIS — I639 Cerebral infarction, unspecified: Secondary | ICD-10-CM | POA: Diagnosis not present

## 2021-03-04 DIAGNOSIS — I10 Essential (primary) hypertension: Secondary | ICD-10-CM | POA: Diagnosis not present

## 2021-03-04 DIAGNOSIS — I639 Cerebral infarction, unspecified: Secondary | ICD-10-CM | POA: Diagnosis not present

## 2021-03-04 DIAGNOSIS — N184 Chronic kidney disease, stage 4 (severe): Secondary | ICD-10-CM | POA: Diagnosis not present

## 2021-03-09 DIAGNOSIS — L280 Lichen simplex chronicus: Secondary | ICD-10-CM | POA: Diagnosis not present

## 2021-03-09 DIAGNOSIS — L814 Other melanin hyperpigmentation: Secondary | ICD-10-CM | POA: Diagnosis not present

## 2021-03-09 DIAGNOSIS — R234 Changes in skin texture: Secondary | ICD-10-CM | POA: Diagnosis not present

## 2021-03-09 DIAGNOSIS — L738 Other specified follicular disorders: Secondary | ICD-10-CM | POA: Diagnosis not present

## 2021-03-09 DIAGNOSIS — L821 Other seborrheic keratosis: Secondary | ICD-10-CM | POA: Diagnosis not present

## 2021-03-09 DIAGNOSIS — Z7901 Long term (current) use of anticoagulants: Secondary | ICD-10-CM | POA: Diagnosis not present

## 2021-03-09 DIAGNOSIS — D225 Melanocytic nevi of trunk: Secondary | ICD-10-CM | POA: Diagnosis not present

## 2021-03-09 DIAGNOSIS — D2270 Melanocytic nevi of unspecified lower limb, including hip: Secondary | ICD-10-CM | POA: Diagnosis not present

## 2021-03-09 DIAGNOSIS — L219 Seborrheic dermatitis, unspecified: Secondary | ICD-10-CM | POA: Diagnosis not present

## 2021-03-09 DIAGNOSIS — L859 Epidermal thickening, unspecified: Secondary | ICD-10-CM | POA: Diagnosis not present

## 2021-03-09 DIAGNOSIS — L119 Acantholytic disorder, unspecified: Secondary | ICD-10-CM | POA: Diagnosis not present

## 2021-03-09 DIAGNOSIS — I480 Paroxysmal atrial fibrillation: Secondary | ICD-10-CM | POA: Diagnosis not present

## 2021-03-09 DIAGNOSIS — L986 Other infiltrative disorders of the skin and subcutaneous tissue: Secondary | ICD-10-CM | POA: Diagnosis not present

## 2021-03-09 DIAGNOSIS — D2260 Melanocytic nevi of unspecified upper limb, including shoulder: Secondary | ICD-10-CM | POA: Diagnosis not present

## 2021-03-09 DIAGNOSIS — D1801 Hemangioma of skin and subcutaneous tissue: Secondary | ICD-10-CM | POA: Diagnosis not present

## 2021-03-09 DIAGNOSIS — L570 Actinic keratosis: Secondary | ICD-10-CM | POA: Diagnosis not present

## 2021-03-09 DIAGNOSIS — L578 Other skin changes due to chronic exposure to nonionizing radiation: Secondary | ICD-10-CM | POA: Diagnosis not present

## 2021-03-09 DIAGNOSIS — D485 Neoplasm of uncertain behavior of skin: Secondary | ICD-10-CM | POA: Diagnosis not present

## 2021-03-24 DIAGNOSIS — Z7901 Long term (current) use of anticoagulants: Secondary | ICD-10-CM | POA: Diagnosis not present

## 2021-03-24 DIAGNOSIS — I480 Paroxysmal atrial fibrillation: Secondary | ICD-10-CM | POA: Diagnosis not present

## 2021-04-06 DIAGNOSIS — E782 Mixed hyperlipidemia: Secondary | ICD-10-CM | POA: Diagnosis not present

## 2021-04-06 DIAGNOSIS — Z0000 Encounter for general adult medical examination without abnormal findings: Secondary | ICD-10-CM | POA: Diagnosis not present

## 2021-04-06 DIAGNOSIS — N184 Chronic kidney disease, stage 4 (severe): Secondary | ICD-10-CM | POA: Diagnosis not present

## 2021-04-06 DIAGNOSIS — I480 Paroxysmal atrial fibrillation: Secondary | ICD-10-CM | POA: Diagnosis not present

## 2021-04-06 DIAGNOSIS — N1832 Chronic kidney disease, stage 3b: Secondary | ICD-10-CM | POA: Diagnosis not present

## 2021-04-06 DIAGNOSIS — I639 Cerebral infarction, unspecified: Secondary | ICD-10-CM | POA: Diagnosis not present

## 2021-04-06 DIAGNOSIS — I10 Essential (primary) hypertension: Secondary | ICD-10-CM | POA: Diagnosis not present

## 2021-04-09 ENCOUNTER — Other Ambulatory Visit: Payer: Self-pay

## 2021-04-09 ENCOUNTER — Ambulatory Visit: Payer: PPO | Attending: Internal Medicine | Admitting: Speech Pathology

## 2021-04-09 DIAGNOSIS — R4701 Aphasia: Secondary | ICD-10-CM | POA: Insufficient documentation

## 2021-04-09 DIAGNOSIS — I6990 Unspecified sequelae of unspecified cerebrovascular disease: Secondary | ICD-10-CM | POA: Diagnosis not present

## 2021-04-10 NOTE — Therapy (Signed)
[place] [facility] SERVICES [address] [place], [place], [address] Phone: [phone]   Fax:  [phone]  Speech Language Pathology Evaluation  Patient Details  Name: Michael Phillips MRN: [id number] Date of Birth: 1940/04/26 Referring Provider (SLP): Dr Michael Phillips   Encounter Date: 04/09/2021   End of Session - 04/10/21 0856     Visit Number 1    Number of Visits 1    Authorization Type [employer]    SLP Start Time 1300    SLP Stop Time  1400    SLP Time Calculation (min) 60 min    Activity Tolerance Patient tolerated treatment well               Subjective Assessment - 04/10/21 0846     Subjective pt pleasant, accompanied by his wife    Patient is accompained by: Family member    Currently in Pain? No/denies                SLP Evaluation [facility] - 04/10/21 2119       SLP Visit Information   SLP Received On 04/10/21    Referring Provider (SLP) Dr Michael Phillips    Onset Date 01/24/2022    Medical Diagnosis CVA      General Information   HPI Pt is a 81 year male referred for speech language evaluation d/t CVA described as an age-indeterminate 10 mm lacunar infarct within the posterior limb of  left internal capsule, possibly subacute.    Behavioral/Cognition appropriate    Mobility Status ambulatory      Balance Screen   Has the patient fallen in the past 6 months No    Has the patient had a decrease in activity level because of a fear of falling?  No    Is the patient reluctant to leave their home because of a fear of falling?  No      Prior Functional Status   Cognitive/Linguistic Baseline Within functional limits    Type of Home House     Lives With Spouse    Available Support Family      Cognition   Overall Cognitive Status Within Functional Limits for tasks assessed      Auditory Comprehension   Overall Auditory Comprehension Appears within functional limits for tasks assessed       Reading Comprehension   Reading Status Within funtional limits      Expression   Primary Mode of Expression Verbal      Verbal Expression   Overall Verbal Expression Appears within functional limits for tasks assessed      Written Expression   Dominant Hand Right    Written Expression Within Functional Limits      Oral Motor/Sensory Function   Overall Oral Motor/Sensory Function Appears within functional limits for tasks assessed      Motor Speech   Overall Motor Speech Appears within functional limits for tasks assessed                             SLP Education - 04/10/21 0855     Education Details word finding strategies    Person(s) Educated Patient;Spouse    Methods Explanation;Demonstration;Verbal cues;Handout    Comprehension Verbalized understanding;Returned demonstration                  Plan - 04/10/21 0856     Clinical Impression Statement Pt presents with adequate  speech language abilities that pt and his wife described as "much improved." Pt's language skills were assessed during complex conversation regarding topics of pt's interest. Pt didn't have episodes of word finding difficulty over the course of 60 minutes. Pt does endorse that he "occasionally can't say a word that I want too." In an effort to help pt during these moments of word finding difficulty, word finding strategies were provided to pt and his wife. Along with a handout, instruction with examples demonstrated of each strategies. They both voiced understanding and feel that pt's language abilities are essentially back to baseline. At this time, skilled ST intervention is not indicated.    Consulted and Agree with Plan of Care Patient;Family member/caregiver              Problem List There are no problems to display for this patient.  Michael Phillips B. Rutherford Nail M.S., CCC-SLP, [facility] Pathologist Rehabilitation Services Office [phone],  [place] 04/10/2021, 10:18 AM  [place] [facility] SERVICES [address] [place], [place], [address] Phone: [phone]   Fax:  [phone]  Name: Michael Phillips MRN: [id number] Date of Birth: 1940/10/13

## 2021-04-13 DIAGNOSIS — I639 Cerebral infarction, unspecified: Secondary | ICD-10-CM | POA: Diagnosis not present

## 2021-04-13 DIAGNOSIS — Z8673 Personal history of transient ischemic attack (TIA), and cerebral infarction without residual deficits: Secondary | ICD-10-CM | POA: Diagnosis not present

## 2021-04-13 DIAGNOSIS — I6523 Occlusion and stenosis of bilateral carotid arteries: Secondary | ICD-10-CM | POA: Diagnosis not present

## 2021-04-16 ENCOUNTER — Encounter: Payer: PPO | Admitting: Speech Pathology

## 2021-04-20 ENCOUNTER — Encounter: Payer: PPO | Admitting: Speech Pathology

## 2021-04-21 DIAGNOSIS — Z7901 Long term (current) use of anticoagulants: Secondary | ICD-10-CM | POA: Diagnosis not present

## 2021-04-22 ENCOUNTER — Encounter: Payer: PPO | Admitting: Speech Pathology

## 2021-04-27 ENCOUNTER — Encounter: Payer: PPO | Admitting: Speech Pathology

## 2021-04-29 ENCOUNTER — Encounter: Payer: PPO | Admitting: Speech Pathology

## 2021-05-04 ENCOUNTER — Encounter: Payer: PPO | Admitting: Speech Pathology

## 2021-05-06 ENCOUNTER — Encounter: Payer: PPO | Admitting: Speech Pathology

## 2021-05-11 ENCOUNTER — Encounter: Payer: PPO | Admitting: Speech Pathology

## 2021-05-13 ENCOUNTER — Encounter: Payer: PPO | Admitting: Speech Pathology

## 2021-05-18 ENCOUNTER — Encounter: Payer: PPO | Admitting: Speech Pathology

## 2021-05-19 DIAGNOSIS — Z7901 Long term (current) use of anticoagulants: Secondary | ICD-10-CM | POA: Diagnosis not present

## 2021-05-20 ENCOUNTER — Encounter: Payer: PPO | Admitting: Speech Pathology

## 2021-06-04 DIAGNOSIS — R7989 Other specified abnormal findings of blood chemistry: Secondary | ICD-10-CM | POA: Diagnosis not present

## 2021-06-04 DIAGNOSIS — Z7901 Long term (current) use of anticoagulants: Secondary | ICD-10-CM | POA: Diagnosis not present

## 2021-06-04 DIAGNOSIS — I480 Paroxysmal atrial fibrillation: Secondary | ICD-10-CM | POA: Diagnosis not present

## 2021-06-04 DIAGNOSIS — I6381 Other cerebral infarction due to occlusion or stenosis of small artery: Secondary | ICD-10-CM | POA: Diagnosis not present

## 2021-06-09 DIAGNOSIS — Z7901 Long term (current) use of anticoagulants: Secondary | ICD-10-CM | POA: Diagnosis not present

## 2021-08-06 DIAGNOSIS — Z79899 Other long term (current) drug therapy: Secondary | ICD-10-CM | POA: Diagnosis not present

## 2021-08-06 DIAGNOSIS — R5383 Other fatigue: Secondary | ICD-10-CM | POA: Diagnosis not present

## 2021-08-06 DIAGNOSIS — I480 Paroxysmal atrial fibrillation: Secondary | ICD-10-CM | POA: Diagnosis not present

## 2021-08-06 DIAGNOSIS — R5382 Chronic fatigue, unspecified: Secondary | ICD-10-CM | POA: Diagnosis not present

## 2021-08-25 DIAGNOSIS — R5383 Other fatigue: Secondary | ICD-10-CM | POA: Diagnosis not present

## 2021-09-14 DIAGNOSIS — G9340 Encephalopathy, unspecified: Secondary | ICD-10-CM | POA: Diagnosis not present

## 2021-09-14 DIAGNOSIS — N3090 Cystitis, unspecified without hematuria: Secondary | ICD-10-CM | POA: Diagnosis not present

## 2021-09-14 DIAGNOSIS — N1832 Chronic kidney disease, stage 3b: Secondary | ICD-10-CM | POA: Diagnosis not present

## 2021-09-19 DEATH — deceased

## 2023-04-17 IMAGING — CT CT HEAD W/O CM
2 series · 15 of 30 positions shown, 17 images · non-contrast
Comparison: No pertinent prior exams available for comparison.

CLINICAL DATA: Acute CVA (cerebrovascular accident). Additional
history provided by scanning technologist: Patient reports feeling
confused and disconnected 2 weeks ago.

EXAM:
CT HEAD WITHOUT CONTRAST
TECHNIQUE: Contiguous axial images were obtained from the base of the skull
through the vertex without intravenous contrast.

[Series 2: head wo · axial · 0.42mm/px · z∈[-75,+45]mm · 7 of 32 slices shown, 9 images]
[im 4/32  brain]
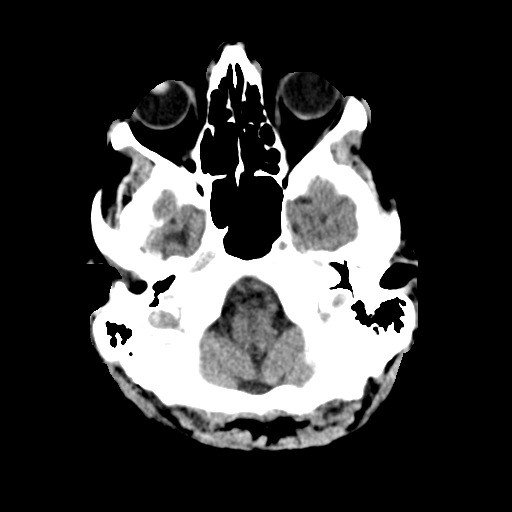
[im 4/32  bone]
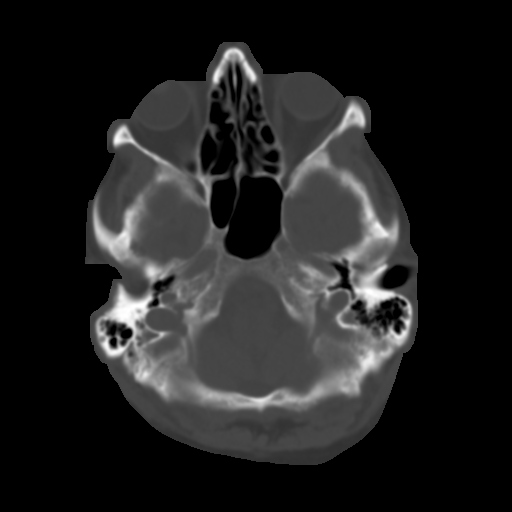
[im 8/32  brain]
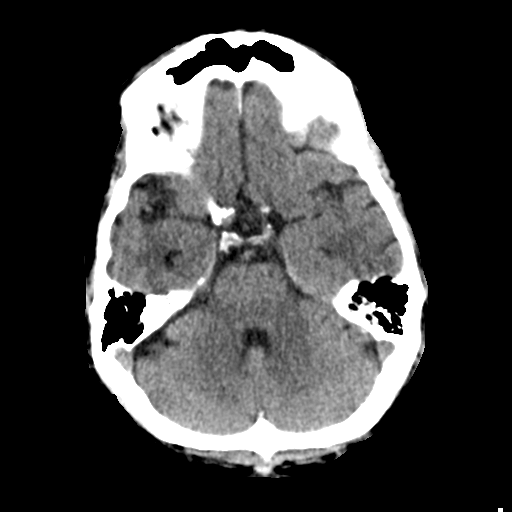
[im 12/32  brain]
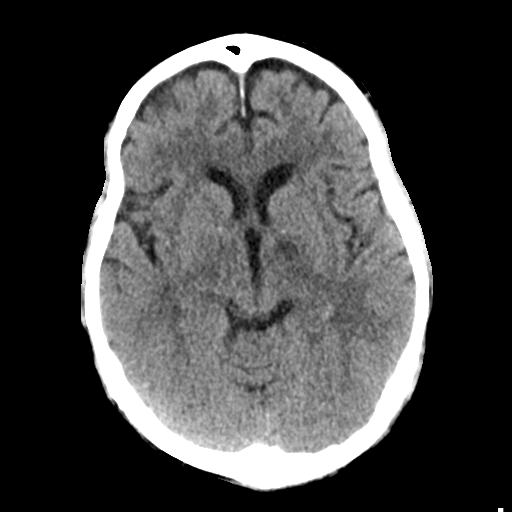
[im 16/32  brain]
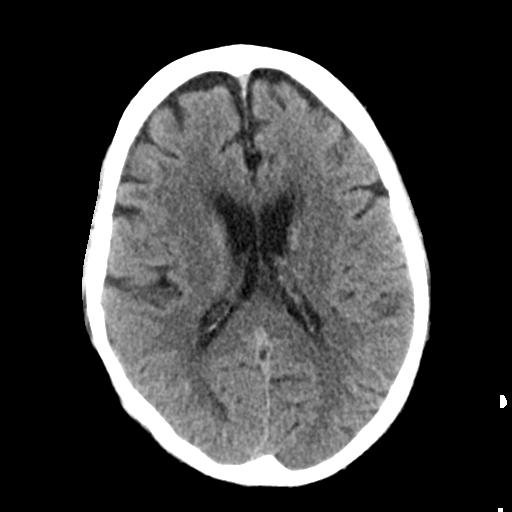
[im 20/32  brain]
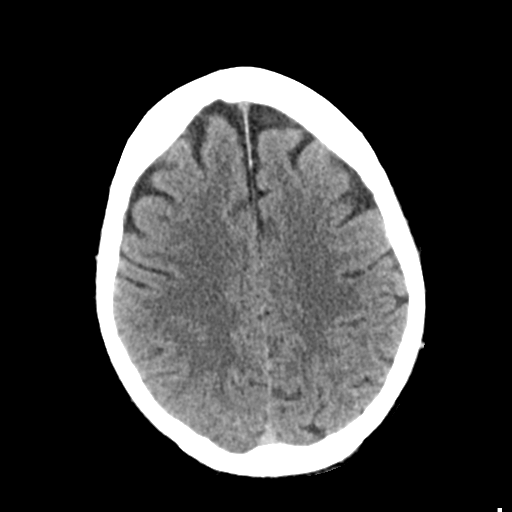
[im 20/32  bone]
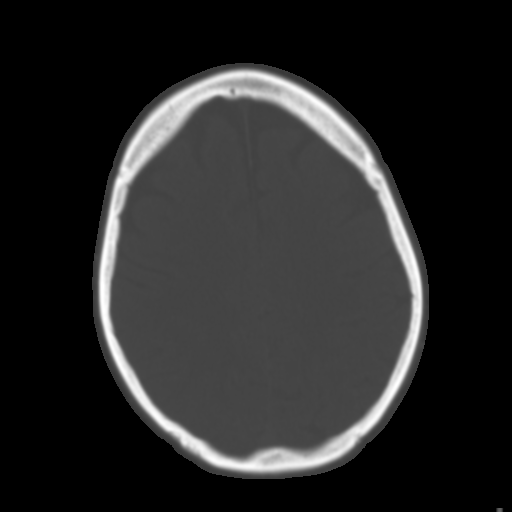
[im 24/32  brain]
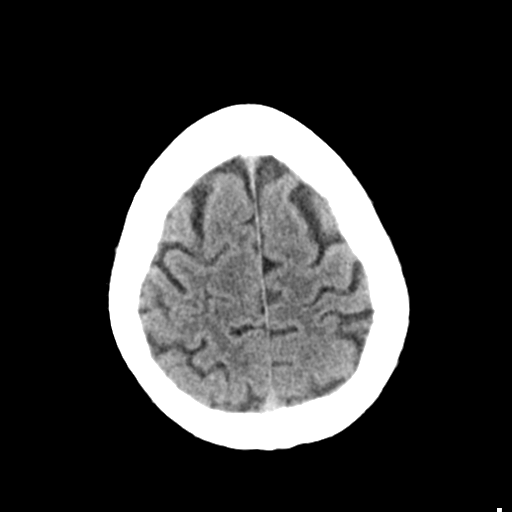
[im 28/32  brain]
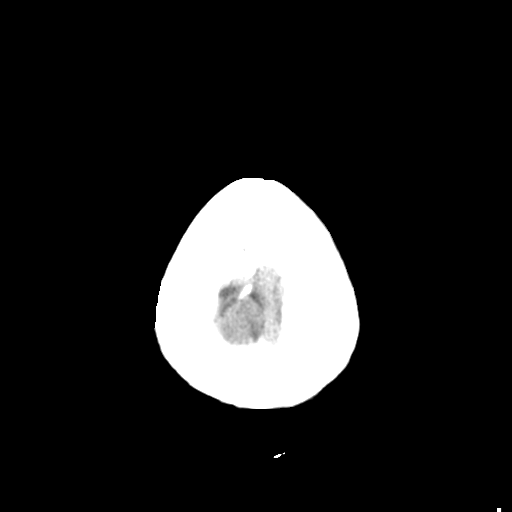

[Series 3: head bone · axial · 0.42mm/px · z∈[-76,+52]mm · 8 of 80 slices shown]
[im 8/80  bone]
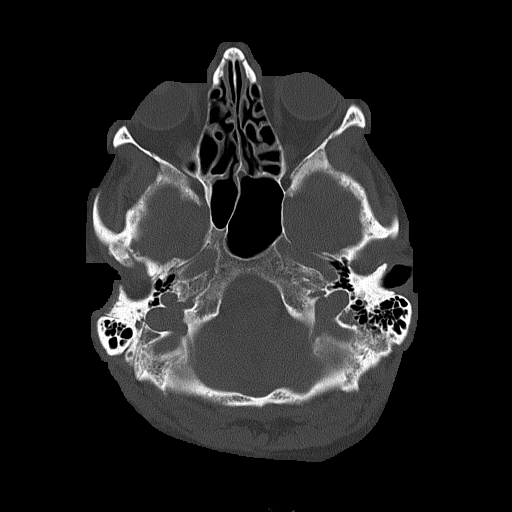
[im 16/80  bone]
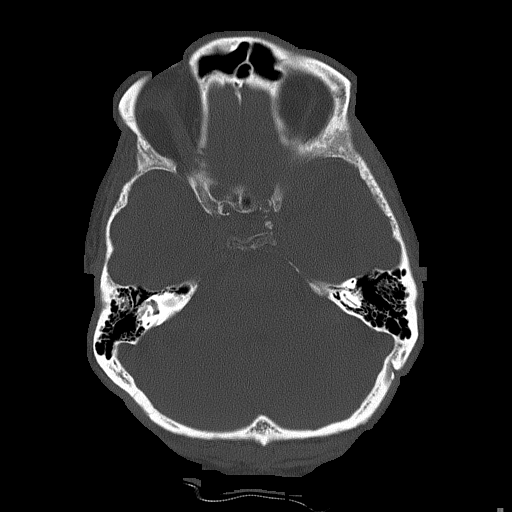
[im 24/80  bone]
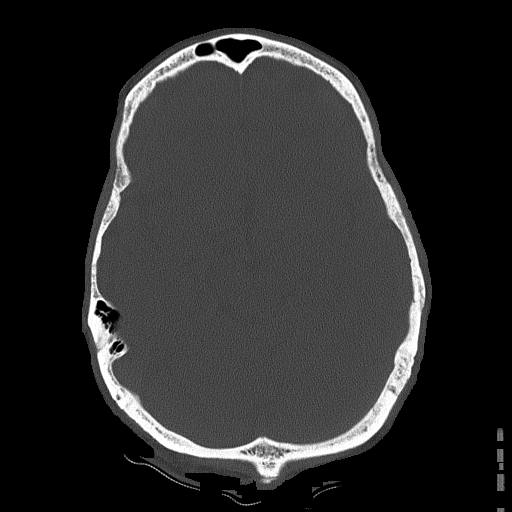
[im 36/80  bone]
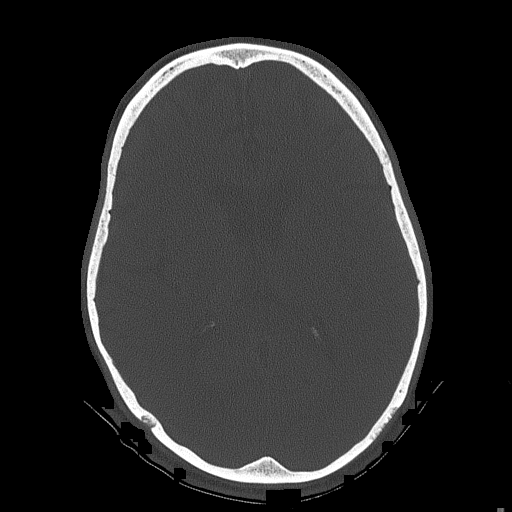
[im 44/80  bone]
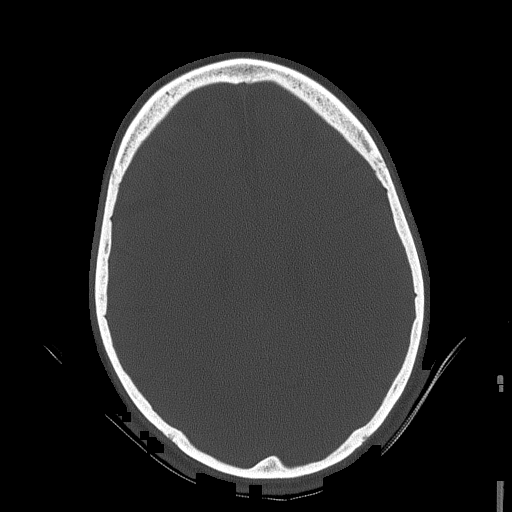
[im 56/80  bone]
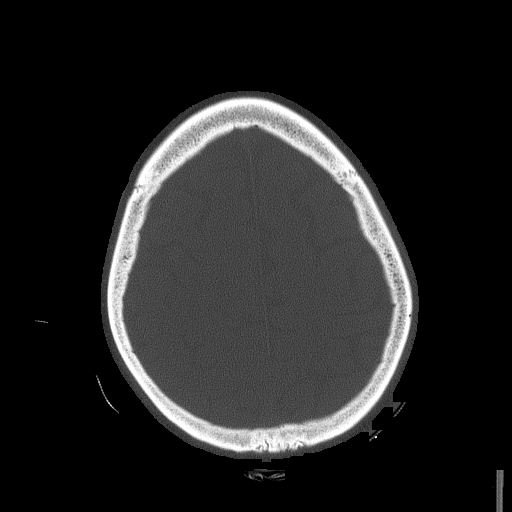
[im 64/80  bone]
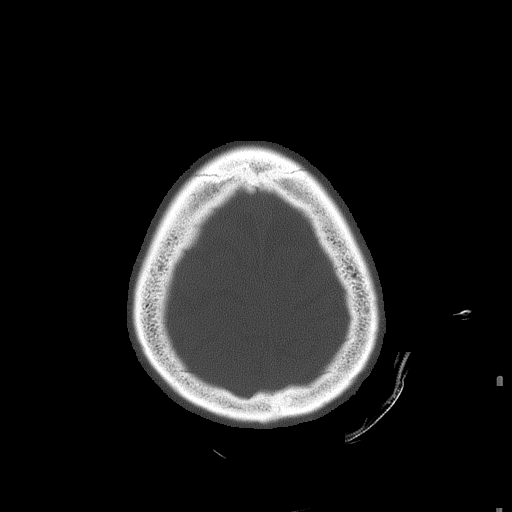
[im 72/80  bone]
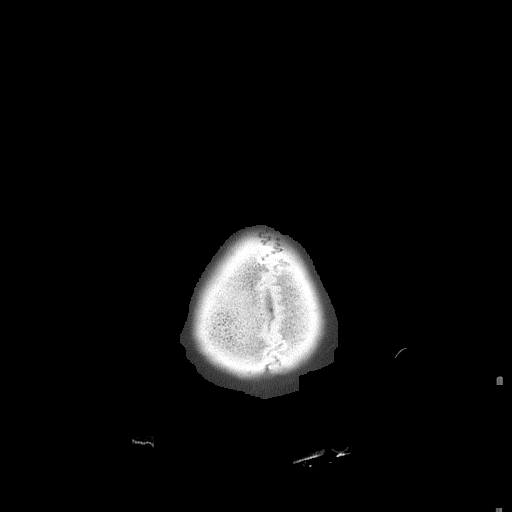

[15 of 30 positions shown; findings below may reference images not displayed]

FINDINGS: Brain:

Cerebral volume appears normal for age.

Age-indeterminate 10 mm lacunar infarct within the posterior limb of
left internal capsule, possibly subacute (series 2, image 12).

There is no acute intracranial hemorrhage.

No demarcated cortical infarct.

No extra-axial fluid collection.

No evidence of an intracranial mass.

No midline shift.

Vascular: No hyperdense vessel.  Atherosclerotic calcifications.

Skull: Normal. Negative for fracture or focal lesion.

Sinuses/Orbits: Visualized orbits show no acute finding. Mild
mucosal thickening within the bilateral ethmoid sinuses.

Impression #1 will be called to the ordering clinician or
representative by the Radiologist Assistant, and communication
documented in the PACS or [REDACTED].
IMPRESSION: Age-indeterminate 10 mm lacunar infarct within the posterior limb of
left internal capsule, possibly subacute. A brain MRI may be
obtained for further evaluation, as clinically warranted.

Otherwise unremarkable non-contrast CT appearance of the brain for
age.

Mild mucosal thickening within the bilateral ethmoid sinuses.
